# Patient Record
Sex: Male | Born: 1995 | Race: Black or African American | Hispanic: No | Marital: Single | Smoking: Never smoker
Health system: Southern US, Community
[De-identification: ages and names within clinical notes are randomized; demographics above are authoritative.]

## PROBLEM LIST (undated history)

## (undated) HISTORY — PX: WISDOM TOOTH EXTRACTION: SHX21

---

## 2018-08-02 ENCOUNTER — Ambulatory Visit (HOSPITAL_COMMUNITY)
Admission: EM | Admit: 2018-08-02 | Discharge: 2018-08-02 | Disposition: A | Discharge status: Home / Self Care | Payer: Self-pay | Attending: Family Medicine | Admitting: Family Medicine

## 2019-06-21 ENCOUNTER — Other Ambulatory Visit: Payer: Self-pay

## 2019-06-21 ENCOUNTER — Emergency Department (HOSPITAL_BASED_OUTPATIENT_CLINIC_OR_DEPARTMENT_OTHER): Payer: No Typology Code available for payment source

## 2019-06-21 ENCOUNTER — Emergency Department (HOSPITAL_BASED_OUTPATIENT_CLINIC_OR_DEPARTMENT_OTHER)
Admission: EM | Admit: 2019-06-21 | Discharge: 2019-06-21 | Disposition: A | Discharge status: Home / Self Care | Payer: No Typology Code available for payment source | Attending: Emergency Medicine | Admitting: Emergency Medicine

## 2019-06-21 ENCOUNTER — Encounter (HOSPITAL_BASED_OUTPATIENT_CLINIC_OR_DEPARTMENT_OTHER): Payer: Self-pay

## 2019-06-21 DIAGNOSIS — R519 Headache, unspecified: Secondary | ICD-10-CM | POA: Diagnosis present

## 2019-06-21 DIAGNOSIS — R220 Localized swelling, mass and lump, head: Secondary | ICD-10-CM | POA: Insufficient documentation

## 2019-06-21 DIAGNOSIS — G44319 Acute post-traumatic headache, not intractable: Secondary | ICD-10-CM | POA: Insufficient documentation

## 2019-06-21 DIAGNOSIS — Z23 Encounter for immunization: Secondary | ICD-10-CM | POA: Diagnosis not present

## 2019-06-21 MED ORDER — METHOCARBAMOL 500 MG PO TABS: 500.0000 mg | ORAL_TABLET | Freq: Two times a day (BID) | ORAL | 0 refills | Status: AC

## 2019-06-21 MED ORDER — TETANUS-DIPHTH-ACELL PERTUSSIS 5-2.5-18.5 LF-MCG/0.5 IM SUSP
0.5000 mL | Freq: Once | INTRAMUSCULAR | Status: AC
  Administered 2019-06-21: 0.5 mL via INTRAMUSCULAR
  Filled 2019-06-21: qty 0.5

## 2019-06-21 NOTE — Discharge Instructions (Signed)
Take Tylenol and ibuprofen as needed for pain.  Is typical for your pain to be worse on days 2 and day 3.  I given a prescription for a muscle relaxer called Robaxin.  Do not drive or operate heavy machinery while taking this medication.  Your imaging did not show any acute abnormality at this time.  Continue to have headaches this may be postconcussive syndrome.  I referred you outpatient to a concussion clinic if need be.  Let warm soapy water run over the abrasion on your right cheek.  You may place bacitracin or Neosporin to this.
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
